# Patient Record
Sex: Male | Born: 1975 | Race: White | Hispanic: No | Marital: Married | Smoking: Former smoker
Health system: Southern US, Community
[De-identification: ages and names within clinical notes are randomized; demographics above are authoritative.]

## PROBLEM LIST (undated history)

## (undated) DIAGNOSIS — I4891 Unspecified atrial fibrillation: Secondary | ICD-10-CM

## (undated) DIAGNOSIS — I4892 Unspecified atrial flutter: Secondary | ICD-10-CM

## (undated) DIAGNOSIS — I10 Essential (primary) hypertension: Secondary | ICD-10-CM

## (undated) DIAGNOSIS — R55 Syncope and collapse: Secondary | ICD-10-CM

## (undated) HISTORY — DX: Unspecified atrial fibrillation: I48.91

## (undated) HISTORY — DX: Essential (primary) hypertension: I10

## (undated) HISTORY — DX: Unspecified atrial flutter: I48.92

---

## 2008-10-05 ENCOUNTER — Encounter (INDEPENDENT_AMBULATORY_CARE_PROVIDER_SITE_OTHER): Payer: Self-pay

## 2008-10-05 ENCOUNTER — Emergency Department (HOSPITAL_COMMUNITY): Admission: EM | Admit: 2008-10-05 | Discharge: 2008-10-06 | Payer: Self-pay | Admitting: Emergency Medicine

## 2008-10-05 ENCOUNTER — Encounter: Payer: Self-pay | Admitting: Cardiology

## 2008-10-05 LAB — CONVERTED CEMR LAB
CK-MB: <1 ng/mL
Calcium: 9.3 mg/dL
Chloride: 101 meq/L
Glomerular Filtration Rate, Af Am: >60 mL/min/{1.73_m2}
HCT: 45.6 %
MCV: 91.6 fL
Platelets: 205 10*3/uL
Potassium: 4.1 meq/L
Sodium: 137 meq/L
Troponin I: <0.05 ng/mL
WBC number, urine, microscopy: 8.5 /hpf

## 2008-10-22 ENCOUNTER — Ambulatory Visit: Payer: Self-pay | Admitting: Cardiology

## 2008-10-22 ENCOUNTER — Encounter (INDEPENDENT_AMBULATORY_CARE_PROVIDER_SITE_OTHER): Payer: Self-pay

## 2008-10-22 DIAGNOSIS — I4892 Unspecified atrial flutter: Secondary | ICD-10-CM

## 2008-10-22 DIAGNOSIS — I4891 Unspecified atrial fibrillation: Secondary | ICD-10-CM

## 2008-10-24 ENCOUNTER — Encounter: Payer: Self-pay | Admitting: Cardiology

## 2008-10-24 ENCOUNTER — Ambulatory Visit: Payer: Self-pay | Admitting: Cardiology

## 2008-10-24 ENCOUNTER — Ambulatory Visit (HOSPITAL_COMMUNITY): Admission: RE | Admit: 2008-10-24 | Discharge: 2008-10-24 | Payer: Self-pay | Admitting: Cardiology

## 2008-10-25 ENCOUNTER — Encounter (INDEPENDENT_AMBULATORY_CARE_PROVIDER_SITE_OTHER): Payer: Self-pay | Admitting: *Deleted

## 2008-10-25 ENCOUNTER — Encounter: Payer: Self-pay | Admitting: Cardiology

## 2008-11-06 ENCOUNTER — Telehealth (INDEPENDENT_AMBULATORY_CARE_PROVIDER_SITE_OTHER): Payer: Self-pay

## 2008-11-20 ENCOUNTER — Ambulatory Visit: Payer: Self-pay | Admitting: Internal Medicine

## 2008-11-25 ENCOUNTER — Ambulatory Visit: Payer: Self-pay | Admitting: Cardiology

## 2008-11-25 ENCOUNTER — Inpatient Hospital Stay (HOSPITAL_COMMUNITY): Admission: EM | Admit: 2008-11-25 | Discharge: 2008-11-25 | Payer: Self-pay | Admitting: Emergency Medicine

## 2008-12-20 ENCOUNTER — Telehealth (INDEPENDENT_AMBULATORY_CARE_PROVIDER_SITE_OTHER): Payer: Self-pay | Admitting: *Deleted

## 2010-03-20 ENCOUNTER — Emergency Department (HOSPITAL_COMMUNITY)
Admission: EM | Admit: 2010-03-20 | Discharge: 2010-03-20 | Disposition: A | Discharge status: Home / Self Care | Payer: Self-pay | Attending: Emergency Medicine | Admitting: Emergency Medicine

## 2010-03-20 ENCOUNTER — Emergency Department (HOSPITAL_COMMUNITY): Payer: Self-pay

## 2010-03-20 DIAGNOSIS — W010XXA Fall on same level from slipping, tripping and stumbling without subsequent striking against object, initial encounter: Secondary | ICD-10-CM | POA: Insufficient documentation

## 2010-03-20 DIAGNOSIS — R232 Flushing: Secondary | ICD-10-CM | POA: Insufficient documentation

## 2010-03-20 DIAGNOSIS — R51 Headache: Secondary | ICD-10-CM | POA: Insufficient documentation

## 2010-03-20 DIAGNOSIS — S0990XA Unspecified injury of head, initial encounter: Secondary | ICD-10-CM | POA: Insufficient documentation

## 2010-03-20 DIAGNOSIS — R259 Unspecified abnormal involuntary movements: Secondary | ICD-10-CM | POA: Insufficient documentation

## 2010-03-20 DIAGNOSIS — R5381 Other malaise: Secondary | ICD-10-CM | POA: Insufficient documentation

## 2010-03-20 DIAGNOSIS — IMO0002 Reserved for concepts with insufficient information to code with codable children: Secondary | ICD-10-CM | POA: Insufficient documentation

## 2010-03-20 DIAGNOSIS — I4891 Unspecified atrial fibrillation: Secondary | ICD-10-CM | POA: Insufficient documentation

## 2010-03-20 DIAGNOSIS — Y929 Unspecified place or not applicable: Secondary | ICD-10-CM | POA: Insufficient documentation

## 2010-03-20 DIAGNOSIS — R55 Syncope and collapse: Secondary | ICD-10-CM | POA: Insufficient documentation

## 2010-03-20 DIAGNOSIS — R5383 Other fatigue: Secondary | ICD-10-CM | POA: Insufficient documentation

## 2010-03-20 DIAGNOSIS — R42 Dizziness and giddiness: Secondary | ICD-10-CM | POA: Insufficient documentation

## 2010-03-20 LAB — URINALYSIS, ROUTINE W REFLEX MICROSCOPIC
Bilirubin Urine: NEGATIVE
Nitrite: NEGATIVE
Specific Gravity, Urine: 1.014 (ref 1.005–1.030)
Urobilinogen, UA: 0.2 mg/dL (ref 0.0–1.0)

## 2010-03-20 LAB — CBC
HCT: 42.1 % (ref 39.0–52.0)
MCHC: 35.2 g/dL (ref 30.0–36.0)
Platelets: 222 10*3/uL (ref 150–400)
RDW: 13 % (ref 11.5–15.5)
WBC: 8.3 10*3/uL (ref 4.0–10.5)

## 2010-03-20 LAB — BASIC METABOLIC PANEL
Calcium: 9.3 mg/dL (ref 8.4–10.5)
GFR calc Af Amer: >60 mL/min (ref >60)
GFR calc non Af Amer: >60 mL/min (ref >60)
Glucose, Bld: 98 mg/dL (ref 70–99)
Sodium: 138 mEq/L (ref 135–145)

## 2010-03-20 LAB — TSH: TSH: 2.204 u[IU]/mL (ref 0.350–4.500)

## 2010-04-08 LAB — URINALYSIS, ROUTINE W REFLEX MICROSCOPIC
Bilirubin Urine: NEGATIVE
Glucose, UA: NEGATIVE mg/dL
Hgb urine dipstick: NEGATIVE
Ketones, ur: NEGATIVE mg/dL
Protein, ur: NEGATIVE mg/dL

## 2010-04-08 LAB — BASIC METABOLIC PANEL
BUN: 13 mg/dL (ref 6–23)
BUN: 16 mg/dL (ref 6–23)
CO2: 24 mEq/L (ref 19–32)
CO2: 28 mEq/L (ref 19–32)
Chloride: 102 mEq/L (ref 96–112)
Chloride: 106 mEq/L (ref 96–112)
Creatinine, Ser: 1 mg/dL (ref 0.4–1.5)
Creatinine, Ser: 1.01 mg/dL (ref 0.4–1.5)
Glucose, Bld: 112 mg/dL — ABNORMAL HIGH (ref 70–99)

## 2010-04-08 LAB — CBC
HCT: 42.9 % (ref 39.0–52.0)
Hemoglobin: 15 g/dL (ref 13.0–17.0)
MCHC: 34.5 g/dL (ref 30.0–36.0)
MCHC: 35 g/dL (ref 30.0–36.0)
MCV: 92.7 fL (ref 78.0–100.0)
Platelets: 271 10*3/uL (ref 150–400)
RDW: 13.3 % (ref 11.5–15.5)

## 2010-04-08 LAB — DIFFERENTIAL
Basophils Absolute: 0 10*3/uL (ref 0.0–0.1)
Basophils Relative: 0 % (ref 0–1)
Basophils Relative: 1 % (ref 0–1)
Eosinophils Absolute: 0.2 10*3/uL (ref 0.0–0.7)
Monocytes Relative: 4 % (ref 3–12)
Neutro Abs: 12.1 10*3/uL — ABNORMAL HIGH (ref 1.7–7.7)
Neutrophils Relative %: 42 % — ABNORMAL LOW (ref 43–77)
Neutrophils Relative %: 85 % — ABNORMAL HIGH (ref 43–77)

## 2010-04-08 LAB — NA AND K (SODIUM & POTASSIUM), RAND UR: Potassium Urine: 30 mEq/L

## 2010-04-08 LAB — COMPREHENSIVE METABOLIC PANEL
Alkaline Phosphatase: 55 U/L (ref 39–117)
BUN: 16 mg/dL (ref 6–23)
Calcium: 8.9 mg/dL (ref 8.4–10.5)
Glucose, Bld: 134 mg/dL — ABNORMAL HIGH (ref 70–99)
Potassium: 4 mEq/L (ref 3.5–5.1)
Total Protein: 6.4 g/dL (ref 6.0–8.3)

## 2010-04-08 LAB — HEMOGLOBIN A1C: Hgb A1c MFr Bld: 5.5 % (ref 4.6–6.1)

## 2010-04-08 LAB — D-DIMER, QUANTITATIVE: D-Dimer, Quant: 0.33 ug/mL-FEU (ref 0.00–0.48)

## 2010-04-08 LAB — TSH: TSH: 3.96 u[IU]/mL (ref 0.350–4.500)

## 2010-04-09 LAB — ACETAMINOPHEN LEVEL: Acetaminophen (Tylenol), Serum: <10 ug/mL — ABNORMAL LOW (ref 10–30)

## 2010-04-09 LAB — POCT CARDIAC MARKERS
CKMB, poc: <1 ng/mL — ABNORMAL LOW (ref 1.0–8.0)
Troponin i, poc: <0.05 ng/mL (ref 0.00–0.09)

## 2010-04-09 LAB — URINALYSIS, ROUTINE W REFLEX MICROSCOPIC
Glucose, UA: NEGATIVE mg/dL
Hgb urine dipstick: NEGATIVE
Ketones, ur: NEGATIVE mg/dL
Protein, ur: NEGATIVE mg/dL

## 2010-04-09 LAB — BASIC METABOLIC PANEL
BUN: 16 mg/dL (ref 6–23)
CO2: 29 mEq/L (ref 19–32)
Chloride: 101 mEq/L (ref 96–112)
Glucose, Bld: 143 mg/dL — ABNORMAL HIGH (ref 70–99)
Potassium: 4.1 mEq/L (ref 3.5–5.1)
Sodium: 137 mEq/L (ref 135–145)

## 2010-04-09 LAB — DIFFERENTIAL
Basophils Absolute: 0 10*3/uL (ref 0.0–0.1)
Eosinophils Absolute: 0.2 10*3/uL (ref 0.0–0.7)
Eosinophils Relative: 3 % (ref 0–5)
Lymphocytes Relative: 28 % (ref 12–46)
Monocytes Absolute: 0.4 10*3/uL (ref 0.1–1.0)

## 2010-04-09 LAB — CBC
HCT: 45.6 % (ref 39.0–52.0)
Hemoglobin: 15.8 g/dL (ref 13.0–17.0)
MCHC: 34.6 g/dL (ref 30.0–36.0)
MCV: 91.6 fL (ref 78.0–100.0)
Platelets: 205 10*3/uL (ref 150–400)
RDW: 12.6 % (ref 11.5–15.5)

## 2010-04-09 LAB — RAPID URINE DRUG SCREEN, HOSP PERFORMED
Amphetamines: NOT DETECTED
Benzodiazepines: NOT DETECTED
Cocaine: NOT DETECTED

## 2010-04-09 LAB — APTT: aPTT: 24 seconds (ref 24–37)

## 2010-04-09 LAB — SALICYLATE LEVEL: Salicylate Lvl: <4 mg/dL (ref 2.8–20.0)

## 2010-12-30 ENCOUNTER — Encounter: Payer: Self-pay | Admitting: Cardiology

## 2013-01-17 ENCOUNTER — Emergency Department (HOSPITAL_COMMUNITY)
Admission: EM | Admit: 2013-01-17 | Discharge: 2013-01-17 | Disposition: A | Discharge status: Home / Self Care | Payer: BC Managed Care – PPO | Attending: Emergency Medicine | Admitting: Emergency Medicine

## 2013-01-17 ENCOUNTER — Encounter (HOSPITAL_COMMUNITY): Payer: Self-pay | Admitting: Emergency Medicine

## 2013-01-17 DIAGNOSIS — M549 Dorsalgia, unspecified: Secondary | ICD-10-CM

## 2013-01-17 DIAGNOSIS — Z87891 Personal history of nicotine dependence: Secondary | ICD-10-CM | POA: Insufficient documentation

## 2013-01-17 DIAGNOSIS — M545 Low back pain, unspecified: Secondary | ICD-10-CM | POA: Insufficient documentation

## 2013-01-17 DIAGNOSIS — Z8679 Personal history of other diseases of the circulatory system: Secondary | ICD-10-CM | POA: Insufficient documentation

## 2013-01-17 LAB — URINALYSIS, ROUTINE W REFLEX MICROSCOPIC
Bilirubin Urine: NEGATIVE
GLUCOSE, UA: NEGATIVE mg/dL
HGB URINE DIPSTICK: NEGATIVE
Ketones, ur: NEGATIVE mg/dL
Leukocytes, UA: NEGATIVE
Nitrite: NEGATIVE
PH: 6.5 (ref 5.0–8.0)
PROTEIN: NEGATIVE mg/dL
Specific Gravity, Urine: 1.025 (ref 1.005–1.030)
Urobilinogen, UA: 1 mg/dL (ref 0.0–1.0)

## 2013-01-17 LAB — COMPREHENSIVE METABOLIC PANEL
ALT: 30 U/L (ref 0–53)
AST: 24 U/L (ref 0–37)
Albumin: 4.1 g/dL (ref 3.5–5.2)
Alkaline Phosphatase: 53 U/L (ref 39–117)
BILIRUBIN TOTAL: 0.4 mg/dL (ref 0.3–1.2)
BUN: 23 mg/dL (ref 6–23)
CALCIUM: 9.7 mg/dL (ref 8.4–10.5)
CHLORIDE: 104 meq/L (ref 96–112)
CO2: 26 meq/L (ref 19–32)
CREATININE: 1.03 mg/dL (ref 0.50–1.35)
GFR, EST NON AFRICAN AMERICAN: 90 mL/min — AB (ref >90)
GLUCOSE: 92 mg/dL (ref 70–99)
Potassium: 4.5 mEq/L (ref 3.7–5.3)
Sodium: 143 mEq/L (ref 137–147)
Total Protein: 7.5 g/dL (ref 6.0–8.3)

## 2013-01-17 LAB — CBC WITH DIFFERENTIAL/PLATELET
BASOS ABS: 0 10*3/uL (ref 0.0–0.1)
Basophils Relative: 0 % (ref 0–1)
EOS PCT: 1 % (ref 0–5)
Eosinophils Absolute: 0.1 10*3/uL (ref 0.0–0.7)
HEMATOCRIT: 41.3 % (ref 39.0–52.0)
HEMOGLOBIN: 14.7 g/dL (ref 13.0–17.0)
LYMPHS ABS: 2.7 10*3/uL (ref 0.7–4.0)
LYMPHS PCT: 33 % (ref 12–46)
MCH: 32.4 pg (ref 26.0–34.0)
MCHC: 35.6 g/dL (ref 30.0–36.0)
MCV: 91 fL (ref 78.0–100.0)
MONO ABS: 0.8 10*3/uL (ref 0.1–1.0)
MONOS PCT: 9 % (ref 3–12)
Neutro Abs: 4.6 10*3/uL (ref 1.7–7.7)
Neutrophils Relative %: 56 % (ref 43–77)
Platelets: 222 10*3/uL (ref 150–400)
RBC: 4.54 MIL/uL (ref 4.22–5.81)
RDW: 12.9 % (ref 11.5–15.5)
WBC: 8.3 10*3/uL (ref 4.0–10.5)

## 2013-01-17 LAB — TROPONIN I

## 2013-01-17 MED ORDER — OXYCODONE-ACETAMINOPHEN 5-325 MG PO TABS
ORAL_TABLET | ORAL | Status: AC
  Filled 2013-01-17: qty 1

## 2013-01-17 MED ORDER — OXYCODONE-ACETAMINOPHEN 5-325 MG PO TABS
2.0000 | ORAL_TABLET | Freq: Once | ORAL | Status: AC
  Administered 2013-01-17: 2 via ORAL
  Filled 2013-01-17: qty 2

## 2013-01-17 MED ORDER — IBUPROFEN 800 MG PO TABS
800.0000 mg | ORAL_TABLET | Freq: Once | ORAL | Status: AC
  Administered 2013-01-17: 800 mg via ORAL
  Filled 2013-01-17: qty 1

## 2013-01-17 MED ORDER — HYDROCODONE-ACETAMINOPHEN 5-325 MG PO TABS: 2.0000 | ORAL_TABLET | ORAL | Status: DC | PRN

## 2013-01-17 MED ORDER — IBUPROFEN 800 MG PO TABS: 800.0000 mg | ORAL_TABLET | Freq: Three times a day (TID) | ORAL | Status: DC

## 2013-01-17 NOTE — ED Notes (Addendum)
Ambulated pt. Pt ambulated with steady gait. Pt denies any dizziness,weakness at time of ambulation.EDP aware, no new orders given at this time.

## 2013-01-17 NOTE — ED Notes (Signed)
Pt states was bending a lot Monday. Now c/o pain to lwoer back radiating down both hips and legs. Nad. Had not taken anything for pain.

## 2013-01-17 NOTE — ED Notes (Signed)
Pt significant other driving pt home.

## 2013-01-17 NOTE — Discharge Instructions (Signed)
Back Pain, Adult Low back pain is very common. About 1 in 5 people have back pain.The cause of low back pain is rarely dangerous. The pain often gets better over time.About half of people with a sudden onset of back pain feel better in just 2 weeks. About 8 in 10 people feel better by 6 weeks.  CAUSES Some common causes of back pain include:  Strain of the muscles or ligaments supporting the spine.  Wear and tear (degeneration) of the spinal discs.  Arthritis.  Direct injury to the back. DIAGNOSIS Most of the time, the direct cause of low back pain is not known.However, back pain can be treated effectively even when the exact cause of the pain is unknown.Answering your caregiver's questions about your overall health and symptoms is one of the most accurate ways to make sure the cause of your pain is not dangerous. If your caregiver needs more information, he or she may order lab work or imaging tests (X-rays or MRIs).However, even if imaging tests show changes in your back, this usually does not require surgery. HOME CARE INSTRUCTIONS For many people, back pain returns.Since low back pain is rarely dangerous, it is often a condition that people can learn to manageon their own.   Remain active. It is stressful on the back to sit or stand in one place. Do not sit, drive, or stand in one place for more than 30 minutes at a time. Take short walks on level surfaces as soon as pain allows.Try to increase the length of time you walk each day.  Do not stay in bed.Resting more than 1 or 2 days can delay your recovery.  Do not avoid exercise or work.Your body is made to move.It is not dangerous to be active, even though your back may hurt.Your back will likely heal faster if you return to being active before your pain is gone.  Pay attention to your body when you bend and lift. Many people have less discomfortwhen lifting if they bend their knees, keep the load close to their bodies,and  avoid twisting. Often, the most comfortable positions are those that put less stress on your recovering back.  Find a comfortable position to sleep. Use a firm mattress and lie on your side with your knees slightly bent. If you lie on your back, put a pillow under your knees.  Only take over-the-counter or prescription medicines as directed by your caregiver. Over-the-counter medicines to reduce pain and inflammation are often the most helpful.Your caregiver may prescribe muscle relaxant drugs.These medicines help dull your pain so you can more quickly return to your normal activities and healthy exercise.  Put ice on the injured area.  Put ice in a plastic bag.  Place a towel between your skin and the bag.  Leave the ice on for 15-20 minutes, 03-04 times a day for the first 2 to 3 days. After that, ice and heat may be alternated to reduce pain and spasms.  Ask your caregiver about trying back exercises and gentle massage. This may be of some benefit.  Avoid feeling anxious or stressed.Stress increases muscle tension and can worsen back pain.It is important to recognize when you are anxious or stressed and learn ways to manage it.Exercise is a great option. SEEK MEDICAL CARE IF:  You have pain that is not relieved with rest or medicine.  You have pain that does not improve in 1 week.  You have new symptoms.  You are generally not feeling well. SEEK   IMMEDIATE MEDICAL CARE IF:   You have pain that radiates from your back into your legs.  You develop new bowel or bladder control problems.  You have unusual weakness or numbness in your arms or legs.  You develop nausea or vomiting.  You develop abdominal pain.  You feel faint. Document Released: 12/21/2004 Document Revised: 06/22/2011 Document Reviewed: 05/11/2010 Exit[employer] Patient Information 2014 [employer].  

## 2013-01-17 NOTE — ED Notes (Signed)
Pt stated as going to room that he had near syncopal episode with being real sweaty yesterday.

## 2013-01-17 NOTE — ED Provider Notes (Signed)
CSN: [id number]     Arrival date & time 01/17/13  1338 History   First MD Initiated Contact with Patient 01/17/13 1353     Chief Complaint  Patient presents with  . Back Pain   (Consider location/radiation/quality/duration/timing/severity/associated sxs/prior Treatment) HPI Comments: Patient complains of a three-day history of low back pain that has been constant and radiates down his left leg. Denies any injuries. He states he was doing some bent over work 2 days ago. He denies any falls or injuries. No focal weakness, numbness or tingling. No bowel or bladder incontinence. No fevers or vomiting. Yesterday he had a near syncopal episode because the pain was so bad. No chest pain or shortness of breath.  The history is provided by the patient.    Past Medical History  Diagnosis Date  . Atrial fibrillation   . Atrial flutter    History reviewed. No pertinent past surgical history. History reviewed. No pertinent family history. History  Substance Use Topics  . Smoking status: Former Games developer  . Smokeless tobacco: Not on file  . Alcohol Use: Yes     Comment: Social    Review of Systems  Constitutional: Negative for fever, activity change and appetite change.  Respiratory: Negative for choking.   Cardiovascular: Negative for chest pain.  Gastrointestinal: Negative for nausea, vomiting and abdominal pain.  Genitourinary: Negative for dysuria and hematuria.  Musculoskeletal: Positive for back pain.  Skin: Negative for rash.  Neurological: Negative for dizziness, weakness and headaches.  A complete 10 system review of systems was obtained and all systems are negative except as noted in the HPI and PMH.    Allergies  Review of patient's allergies indicates no known allergies.  Home Medications   Current Outpatient Rx  Name  Route  Sig  Dispense  Refill  . ibuprofen (ADVIL,MOTRIN) 200 MG tablet   Oral   Take 400 mg by mouth every 6 (six) hours as needed for moderate pain.          Marland Kitchen HYDROcodone-acetaminophen (NORCO/VICODIN) 5-325 MG per tablet   Oral   Take 2 tablets by mouth every 4 (four) hours as needed.   10 tablet   0   . ibuprofen (ADVIL,MOTRIN) 800 MG tablet   Oral   Take 1 tablet (800 mg total) by mouth 3 (three) times daily.   21 tablet   0    BP 125/74  Pulse 73  Temp(Src) 97.3 F (36.3 C) (Oral)  Resp 21  SpO2 96% Physical Exam  Constitutional: He is oriented to person, place, and time. He appears well-developed and well-nourished. No distress.  HENT:  Head: Normocephalic and atraumatic.  Mouth/Throat: Oropharynx is clear and moist. No oropharyngeal exudate.  Eyes: Conjunctivae and EOM are normal. Pupils are equal, round, and reactive to light.  Neck: Normal range of motion. Neck supple.  Cardiovascular: Normal rate, regular rhythm and normal heart sounds.   No murmur heard. Pulmonary/Chest: Effort normal and breath sounds normal. No respiratory distress.  Abdominal: Soft. There is no tenderness. There is no rebound and no guarding.  Musculoskeletal: Normal range of motion. He exhibits no edema and no tenderness.  5/5 strength in bilateral lower extremities. Ankle plantar and dorsiflexion intact. Great toe extension intact bilaterally. +2 DP and PT pulses. +2 patellar reflexes bilaterally. Normal gait.  L paraspinal lumbar tenderness  Neurological: He is alert and oriented to person, place, and time. No cranial nerve deficit. He exhibits normal muscle tone. Coordination normal.  Skin: Skin is warm.  ED Course  Procedures (including critical care time) Labs Review Labs Reviewed  COMPREHENSIVE METABOLIC PANEL - Abnormal; Notable for the following:    GFR calc non Af Amer 90 (*)    All other components within normal limits  URINALYSIS, ROUTINE W REFLEX MICROSCOPIC  CBC WITH DIFFERENTIAL  TROPONIN I   Imaging Review No results found.  EKG Interpretation    Date/Time:  Wednesday January 17 2013 13:46:58 EST Ventricular Rate:   76 PR Interval:  202 QRS Duration: 110 QT Interval:  396 QTC Calculation: 445 R Axis:   -3 Text Interpretation:  Normal sinus rhythm Normal ECG When compared with ECG of 20-Mar-2010 13:37, No significant change was found No significant change was found Confirmed by Manus GunningANCOUR  MD, Alok Minshall ([id number]) on 01/17/2013 2:11:40 PM            MDM   1. Back pain    Low back pain after bending over 2 days ago. Radiates in the left leg. No focal weakness, numbness or tingling. No bowel bladder incontinence.  UA negative.  Pain improved after meds in ED.  Able to ambulate without difficulty.  Will treat as sciatica.  Antiinflammatories and analgesics.  PCP referral.  No evidence of cord compression or cauda equina.   Glynn OctaveStephen Aniruddh Ciavarella, MD 01/17/13 [phone]

## 2013-02-06 ENCOUNTER — Ambulatory Visit (INDEPENDENT_AMBULATORY_CARE_PROVIDER_SITE_OTHER): Payer: BC Managed Care – PPO | Admitting: Family Medicine

## 2013-02-06 ENCOUNTER — Encounter: Payer: Self-pay | Admitting: Family Medicine

## 2013-02-06 VITALS — BP 102/70 | HR 73 | Temp 98.2°F | Resp 18 | Ht 72.0 in | Wt 238.0 lb

## 2013-02-06 DIAGNOSIS — R6889 Other general symptoms and signs: Secondary | ICD-10-CM

## 2013-02-06 DIAGNOSIS — I4891 Unspecified atrial fibrillation: Secondary | ICD-10-CM

## 2013-02-06 DIAGNOSIS — IMO0001 Reserved for inherently not codable concepts without codable children: Secondary | ICD-10-CM

## 2013-02-06 DIAGNOSIS — R109 Unspecified abdominal pain: Secondary | ICD-10-CM | POA: Insufficient documentation

## 2013-02-06 DIAGNOSIS — R61 Generalized hyperhidrosis: Secondary | ICD-10-CM

## 2013-02-06 DIAGNOSIS — Z87898 Personal history of other specified conditions: Secondary | ICD-10-CM

## 2013-02-06 DIAGNOSIS — R197 Diarrhea, unspecified: Secondary | ICD-10-CM | POA: Insufficient documentation

## 2013-02-06 DIAGNOSIS — R42 Dizziness and giddiness: Secondary | ICD-10-CM

## 2013-02-06 DIAGNOSIS — Z9189 Other specified personal risk factors, not elsewhere classified: Secondary | ICD-10-CM

## 2013-02-06 DIAGNOSIS — Z8709 Personal history of other diseases of the respiratory system: Secondary | ICD-10-CM
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
## 2013-02-06 NOTE — Patient Instructions (Signed)
Atrial Fibrillation Atrial fibrillation is a type of irregular heart rhythm (arrhythmia). During atrial fibrillation, the upper chambers of the heart (atria) quiver continuously in a chaotic pattern. This causes an irregular and often rapid heart rate.  Atrial fibrillation is the result of the heart becoming overloaded with disorganized signals that tell it to beat. These signals are normally released one at a time by a part of the right atrium called the sinoatrial node. They then travel from the atria to the lower chambers of the heart (ventricles), causing the atria and ventricles to contract and pump blood as they pass. In atrial fibrillation, parts of the atria outside of the sinoatrial node also release these signals. This results in two problems. First, the atria receive so many signals that they do not have time to fully contract. Second, the ventricles, which can only receive one signal at a time, beat irregularly and out of rhythm with the atria.  There are three types of atrial fibrillation:   Paroxysmal Paroxysmal atrial fibrillation starts suddenly and stops on its own within a week.   Persistent Persistent atrial fibrillation lasts for more than a week. It may stop on its own or with treatment.   Permanent Permanent atrial fibrillation does not go away. Episodes of atrial fibrillation may lead to permanent atrial fibrillation.  Atrial fibrillation can prevent your heart from pumping blood normally. It increases your risk of stroke and can lead to heart failure.  CAUSES   Heart conditions, including a heart attack, heart failure, coronary artery disease, and heart valve conditions.   Inflammation of the sac that surrounds the heart (pericarditis).   Blockage of an artery in the lungs (pulmonary embolism).   Pneumonia or other infections.   Chronic lung disease.   Thyroid problems, especially if the thyroid is overactive (hyperthyroidism).   Caffeine, excessive alcohol  use, and use of some illegal drugs.   Use of some medications, including certain decongestants and diet pills.   Heart surgery.   Birth defects.  Sometimes, no cause can be found. When this happens, the atrial fibrillation is called lone atrial fibrillation. The risk of complications from atrial fibrillation increases if you have lone atrial fibrillation and you are age 40 years or older. RISK FACTORS  Heart failure.  Coronary artery disease  Diabetes mellitus.   High blood pressure (hypertension).   Obesity.   Other arrhythmias.   Increased age. SYMPTOMS   A feeling that your heart is beating rapidly or irregularly.   A feeling of discomfort or pain in your chest.   Shortness of breath.   Sudden lightheadedness or weakness.   Getting tired easily when exercising.   Urinating more often than normal (mainly when atrial fibrillation first begins).  In paroxysmal atrial fibrillation, symptoms may start and suddenly stop. DIAGNOSIS  Your caregiver may be able to detect atrial fibrillation when taking your pulse. Usually, testing is needed to diagnosis atrial fibrillation. Tests may include:   Electrocardiography. During this test, the electrical impulses of your heart are recorded while you are lying down.   Echocardiography. During echocardiography, sound waves are used to evaluate how blood flows through your heart.   Stress test. There is more than one type of stress test. If a stress test is needed, ask your caregiver about which type is best for you.   Chest X-ray exam.   Blood tests.   Computed tomography (CT).  TREATMENT   Treating any underlying conditions. For example, if you have an overactive  thyroid, treating the condition may correct atrial fibrillation.   Medication. Medications may be given to control a rapid heart rate or to prevent blood clots, heart failure, or a stroke.   Procedure to correct the rhythm of the  heart:  Electrical cardioversion. During electrical cardioversion, a controlled, low-energy shock is delivered to the heart through your skin. If you have chest pain, very low pressure blood pressure, or sudden heart failure, this procedure may need to be done as an emergency.  Catheter ablation. During this procedure, heart tissues that send the signals that cause atrial fibrillation are destroyed.  Maze or minimaze procedure. During this surgery, thin lines of heart tissue that carry the abnormal signals are destroyed. The maze procedure is an open-heart surgery. The minimaze procedure is a minimally invasive surgery. This means that small cuts are made to access the heart instead of a large opening.  Pulmonary venous isolation. During this surgery, tissue around the veins that carry blood from the lungs (pulmonary veins) is destroyed. This tissue is thought to carry the abnormal signals. HOME CARE INSTRUCTIONS   Take medications as directed by your caregiver.  Only take medications that your caregiver approves. Some medications can make atrial fibrillation worse or recur.  If blood thinners were prescribed by your caregiver, take them exactly as directed. Too much can cause bleeding. Too little and you will not have the needed protection against stroke and other problems.  Perform blood tests at home if directed by your caregiver.  Perform blood tests exactly as directed.   Quit smoking if you smoke.   Do not drink alcohol.   Do not drink caffeinated beverages such as coffee, soda, and some teas. You may drink decaffeinated coffee, soda, or tea.   Maintain a healthy weight. Do not use diet pills unless your caregiver approves. They may make heart problems worse.   Follow diet instructions as directed by your caregiver.   Exercise regularly as directed by your caregiver.   Keep all follow-up appointments. PREVENTION  The following substances can cause atrial fibrillation  to recur:   Caffeinated beverages.   Alcohol.   Certain medications, especially those used for breathing problems.   Certain herbs and herbal medications, such as those containing ephedra or ginseng.  Illegal drugs such as cocaine and amphetamines. Sometimes medications are given to prevent atrial fibrillation from recurring. Proper treatment of any underlying condition is also important in helping prevent recurrence.  SEEK MEDICAL CARE IF:  You notice a change in the rate, rhythm, or strength of your heartbeat.   You suddenly begin urinating more frequently.   You tire more easily when exerting yourself or exercising.  SEEK IMMEDIATE MEDICAL CARE IF:   You develop chest pain, abdominal pain, sweating, or weakness.  You feel sick to your stomach (nauseous).  You develop shortness of breath.  You suddenly develop swollen feet and ankles.  You feel dizzy.  You face or limbs feel numb or weak.  There is a change in your vision or speech. MAKE SURE YOU:   Understand these instructions.  Will watch your condition.  Will get help right away if you are not doing well or get worse. Document Released: 12/21/2004 Document Revised: 04/17/2012 Document Reviewed: 02/01/2012 [facility] Patient Information 2014 [employer], [place].  Catecholamines, Plasma and Urine This is a test used to evaluate persistent or episodic high blood pressure, severe headaches, rapid heart rate, and sweating. Catecholamines are a group of similar hormones produced in the medulla (central portion)  of the adrenal glands. The primary catecholamines are dopamine, epinephrine (adrenaline), and norepinephrine. These hormones are released into the bloodstream in response to physical or emotional stress. They help transmit nerve impulses in the brain, increase glucose and fatty acid release (for energy), dilate bronchioles (small air passages in the lungs), and dilate the pupils. Norepinephrine also constricts  blood vessels (increasing blood pressure) and epinephrine increases heart rate and metabolism.  Urine and plasma catecholamine testing can be used to help detect the presence of pheochromocytomas and paragangliomas. It is important to diagnose and treat these rare tumors because they cause a potentially curable form of hypertension. In most cases, the tumors can be surgically removed and/or treated to significantly reduce the amount of catecholamines being produced and to reduce or eliminate their associated symptoms and complications.  Catecholamine testing measures the amount of epinephrine, norepinephrine, and dopamine in the plasma or urine. (The metabolites of these hormones may be measured separately with a urine metanephrine and/or VMA test). The plasma catecholamine test measures the amount of hormones present at the moment of collection, while the urine test measures the amount excreted over a 24-hour period. PREPARATION FOR TEST A blood sample drawn from a vein in the arm or a 24-hour urine sample is collected. For the 24-hour urine collection, all of your urine should be saved for a 24-hour period. It is important that the sample be refrigerated during this time period. Since diet, exercise, and drugs may affect catecholamine levels, precautions need to be taken to assure that the sample reflects a true metabolic condition and not an interference or aberration. For this reason you should talk to your caregiver about your diet and any medications you are taking. Foods such as coffee (including decaf), tea, chocolate, vanilla, bananas, oranges and other citrus fruits should be avoided for several days prior to the test and during collection. There are also many medications that can potentially affect test results. Talk to your caregiver about the prescriptions and over the counter drugs and supplements that you are taking. Wherever possible, those that are known to interfere should be discontinued  prior to and during sample collection. Emotional and physical stresses and vigorous exercise should be minimized prior to and during test collection as they can increase catecholamine secretion. For urine collection tests in females, tell the person doing the test if you are menstruating on the day the test is done. NORMAL FINDINGS VMA  Adult/elderly: Less than 6.8 mg/24 hr or <35 micromole/24 hr (SI units)  Adolescent: 1-5 mg/24 hr  Child: 1-3 mg/24 hr  Infant: Less than 2 mg/24 hr  Newborn: Less than 1 mg/24 hr Catecholamines  Free Catecholamines  Less than 100 mcg/24 hr or <590 nmol/day (SI units) Epinephrine  Adult/elderly: Less than 20 mcg/24 hr or <109 nmol/day (SI units)  Child:  0-1 years: 0-2.5 mcg/24 hr  1-2 years: 0-3.5 mcg/24 hr  2-4 years: 0-6 mcg/24 hr  4-7 years: 0.2-10 mcg/24 hr  7-10 years: 0.5-14 mcg/24 hr Norepinephrine  Adult/elderly: Less than 100 mcg/24 hr or Less than 590 nmol/day (SI units)  Child:  0-1 years: 0-10 mcg/24 hr  1-2 years: 0-17 mcg/24 hr  2-4 years: 4-29 mcg/24 hr  4-7 years: 8-45 mcg/24 hr  7-10 years: 13-65 mcg/24 hr Dopamine  Adult/elderly: 65-400 mcg/24 hr  Child:  0-1 year: 0-85 mcg/24 hr  1-2 years: 10-140 mcg/24 hr  2-4 years: 40-260 mcg/24 hr  Greater than 4 years: 65-400 mcg/24 hr Metanephrine  Less than 1.3 mg/24  hr or Less than 7 micromole/day (SI units) Normetanephrine  15-80 mcg/24 hr or 89-473 nmol/day (SI units) Ranges for normal findings may vary among different laboratories and hospitals. You should always check with your doctor after having lab work or other tests done to discuss the meaning of your test results and whether your values are considered within normal limits. MEANING OF TEST  Your caregiver will go over the test results with you and discuss the importance and meaning of your results, as well as treatment options and the need for additional tests if necessary. OBTAINING THE TEST  RESULTS It is your responsibility to obtain your test results. Ask the lab or department performing the test when and how you will get your results. Document Released: 01/13/2004 Document Revised: 03/15/2011 Document Reviewed: 11/30/2007 [facility] Patient Information 2014 [place], [place].  Syncope Syncope is a fainting spell. This means the person loses consciousness and drops to the ground. The person is generally unconscious for less than 5 minutes. The person may have some muscle twitches for up to 15 seconds before waking up and returning to normal. Syncope occurs more often in elderly people, but it can happen to anyone. While most causes of syncope are not dangerous, syncope can be a sign of a serious medical problem. It is important to seek medical care.  CAUSES  Syncope is caused by a sudden decrease in blood flow to the brain. The specific cause is often not determined. Factors that can trigger syncope include:  Taking medicines that lower blood pressure.  Sudden changes in posture, such as standing up suddenly.  Taking more medicine than prescribed.  Standing in one place for too long.  Seizure disorders.  Dehydration and excessive exposure to heat.  Low blood sugar (hypoglycemia).  Straining to have a bowel movement.  Heart disease, irregular heartbeat, or other circulatory problems.  Fear, emotional distress, seeing blood, or severe pain. SYMPTOMS  Right before fainting, you may:  Feel dizzy or lightheaded.  Feel nauseous.  See all white or all black in your field of vision.  Have cold, clammy skin. DIAGNOSIS  Your caregiver will ask about your symptoms, perform a physical exam, and perform electrocardiography (ECG) to record the electrical activity of your heart. Your caregiver may also perform other heart or blood tests to determine the cause of your syncope. TREATMENT  In most cases, no treatment is needed. Depending on the cause of your syncope, your caregiver  may recommend changing or stopping some of your medicines. HOME CARE INSTRUCTIONS  Have someone stay with you until you feel stable.  Do not drive, operate machinery, or play sports until your caregiver says it is okay.  Keep all follow-up appointments as directed by your caregiver.  Lie down right away if you start feeling like you might faint. Breathe deeply and steadily. Wait until all the symptoms have passed.  Drink enough fluids to keep your urine clear or pale yellow.  If you are taking blood pressure or heart medicine, get up slowly, taking several minutes to sit and then stand. This can reduce dizziness. SEEK IMMEDIATE MEDICAL CARE IF:   You have a severe headache.  You have unusual pain in the chest, abdomen, or back.  You are bleeding from the mouth or rectum, or you have black or tarry stool.  You have an irregular or very fast heartbeat.  You have pain with breathing.  You have repeated fainting or seizure-like jerking during an episode.  You faint when sitting or  lying down.  You have confusion.  You have difficulty walking.  You have severe weakness.  You have vision problems. If you fainted, call your local emergency services (911 in [place] Do not drive yourself to the hospital.  MAKE SURE YOU:  Understand these instructions.  Will watch your condition.  Will get help right away if you are not doing well or get worse. Document Released: 12/21/2004 Document Revised: 06/22/2011 Document Reviewed: 02/19/2011 [facility] Patient Information 2014 [place], [place].

## 2013-02-08 ENCOUNTER — Other Ambulatory Visit: Payer: Self-pay | Admitting: Family Medicine

## 2013-02-08 ENCOUNTER — Ambulatory Visit (HOSPITAL_COMMUNITY)
Admission: RE | Admit: 2013-02-08 | Discharge: 2013-02-08 | Disposition: A | Discharge status: Home / Self Care | Payer: BC Managed Care – PPO | Source: Ambulatory Visit | Attending: Family Medicine | Admitting: Family Medicine

## 2013-02-08 ENCOUNTER — Encounter: Payer: Self-pay | Admitting: Family Medicine

## 2013-02-08 DIAGNOSIS — R42 Dizziness and giddiness: Secondary | ICD-10-CM

## 2013-02-08 DIAGNOSIS — Z6832 Body mass index (BMI) 32.0-32.9, adult: Secondary | ICD-10-CM | POA: Insufficient documentation

## 2013-02-08 DIAGNOSIS — I4891 Unspecified atrial fibrillation: Secondary | ICD-10-CM | POA: Insufficient documentation

## 2013-02-08 DIAGNOSIS — IMO0001 Reserved for inherently not codable concepts without codable children: Secondary | ICD-10-CM

## 2013-02-08 DIAGNOSIS — I454 Nonspecific intraventricular block: Secondary | ICD-10-CM

## 2013-02-08 DIAGNOSIS — R55 Syncope and collapse: Secondary | ICD-10-CM | POA: Insufficient documentation

## 2013-02-08 DIAGNOSIS — I4892 Unspecified atrial flutter: Secondary | ICD-10-CM

## 2013-02-08 DIAGNOSIS — Z87898 Personal history of other specified conditions: Secondary | ICD-10-CM

## 2013-02-08 DIAGNOSIS — I519 Heart disease, unspecified: Secondary | ICD-10-CM

## 2013-02-08 LAB — CBC WITH DIFFERENTIAL/PLATELET
BASOS ABS: 0 10*3/uL (ref 0.0–0.1)
Basophils Relative: 0 % (ref 0–1)
Eosinophils Absolute: 0.1 10*3/uL (ref 0.0–0.7)
Eosinophils Relative: 2 % (ref 0–5)
HEMATOCRIT: 41.8 % (ref 39.0–52.0)
Hemoglobin: 14.8 g/dL (ref 13.0–17.0)
LYMPHS PCT: 35 % (ref 12–46)
Lymphs Abs: 2.4 10*3/uL (ref 0.7–4.0)
MCH: 31 pg (ref 26.0–34.0)
MCHC: 35.4 g/dL (ref 30.0–36.0)
MCV: 87.6 fL (ref 78.0–100.0)
Monocytes Absolute: 0.6 10*3/uL (ref 0.1–1.0)
Monocytes Relative: 9 % (ref 3–12)
Neutro Abs: 3.7 10*3/uL (ref 1.7–7.7)
Neutrophils Relative %: 54 % (ref 43–77)
Platelets: 280 10*3/uL (ref 150–400)
RBC: 4.77 MIL/uL (ref 4.22–5.81)
RDW: 13.2 % (ref 11.5–15.5)
WBC: 6.8 10*3/uL (ref 4.0–10.5)

## 2013-02-08 LAB — COMPREHENSIVE METABOLIC PANEL
ALT: 43 U/L (ref 0–53)
AST: 30 U/L (ref 0–37)
Albumin: 4.4 g/dL (ref 3.5–5.2)
Alkaline Phosphatase: 56 U/L (ref 39–117)
BUN: 16 mg/dL (ref 6–23)
CO2: 28 mEq/L (ref 19–32)
CREATININE: 0.99 mg/dL (ref 0.50–1.35)
Calcium: 9.8 mg/dL (ref 8.4–10.5)
Chloride: 106 mEq/L (ref 96–112)
Glucose, Bld: 94 mg/dL (ref 70–99)
Potassium: 4.3 mEq/L (ref 3.5–5.3)
Sodium: 142 mEq/L (ref 135–145)
Total Bilirubin: 0.5 mg/dL (ref 0.2–1.2)
Total Protein: 7.1 g/dL (ref 6.0–8.3)

## 2013-02-08 LAB — T4, FREE: Free T4: 1.17 ng/dL (ref 0.80–1.80)

## 2013-02-08 LAB — TSH: TSH: 1.888 u[IU]/mL (ref 0.350–4.500)

## 2013-02-08 LAB — T3, FREE: T3, Free: 3.4 pg/mL (ref 2.3–4.2)

## 2013-02-08 NOTE — Progress Notes (Signed)
*  PRELIMINARY RESULTS* Echocardiogram 2D Echocardiogram has been performed.  Anthony Underwood 02/08/2013, 10:41 AM

## 2013-02-08 NOTE — Progress Notes (Signed)
Attempted to contact the patient regarding the ECHO report. No answer but have left message for him to return call. Have also placed referral to cardiologist.

## 2013-02-09 LAB — URINALYSIS
Bilirubin Urine: NEGATIVE
Glucose, UA: NEGATIVE mg/dL
Hgb urine dipstick: NEGATIVE
KETONES UR: NEGATIVE mg/dL
LEUKOCYTES UA: NEGATIVE
NITRITE: NEGATIVE
PH: 5.5 (ref 5.0–8.0)
Protein, ur: NEGATIVE mg/dL
Specific Gravity, Urine: 1.022 (ref 1.005–1.030)
Urobilinogen, UA: 0.2 mg/dL (ref 0.0–1.0)

## 2013-02-11 LAB — CATECHOLAMINES, FRACTIONATED, PLASMA
Catecholamines, Total: 339 pg/mL
EPINEPHRINE: 59 pg/mL
NOREPINEPHRINE: 280 pg/mL

## 2013-02-13 ENCOUNTER — Encounter: Payer: Self-pay | Admitting: Family Medicine

## 2013-04-13 ENCOUNTER — Ambulatory Visit (INDEPENDENT_AMBULATORY_CARE_PROVIDER_SITE_OTHER): Payer: 59 | Admitting: Cardiology

## 2013-04-13 ENCOUNTER — Encounter: Payer: Self-pay | Admitting: Cardiology

## 2013-04-13 VITALS — BP 130/82 | HR 79 | Ht 73.0 in | Wt 239.0 lb

## 2013-04-13 DIAGNOSIS — I4891 Unspecified atrial fibrillation: Secondary | ICD-10-CM

## 2013-04-13 MED ORDER — ASPIRIN EC 81 MG PO TBEC
81.0000 mg | DELAYED_RELEASE_TABLET | Freq: Every day | ORAL | Status: DC
Start: 2013-04-13 — End: 2013-06-20

## 2013-04-13 MED ORDER — METOPROLOL TARTRATE 25 MG PO TABS: 25.0000 mg | ORAL_TABLET | Freq: Two times a day (BID) | ORAL | Status: DC

## 2013-04-13 NOTE — Progress Notes (Signed)
Clinical Summary [name] is a 38 y.o.male seen today as a new patient for chest pain   1. Afib/Aflutter - notes from 2010 noted afib and aflutter, up to 200 bpm - previously was on cardizem and coumadin - on further follow up, given a reported CHADS2Score of 0 coumadin was stopped and he was started on ASA daily.  - cardizem stopped a few years ago on his own b/c of dizzy spells while on medicine and also cost, he stopped his ASA as well. Has not followed up with cardiology  - feels episodes of palpitations, feels hot all over, sweaty, nausea. Last up to 1 hour. No chest pain. Occurs sporadically. Started approx 2011, feels like somewhat less frequent.  - no set triggers for episodes, though tends to be most often at night.  -From recent labs TSH 1.888, K4.3, normal catecholamines Echo 02/2013 LVEF 50-55%, no WMAs, grade I diastolic dysfunction   Past Medical History  Diagnosis Date  . Atrial fibrillation   . Atrial flutter      No Known Allergies   No current outpatient prescriptions on file.   No current facility-administered medications for this visit.     No past surgical history on file.   No Known Allergies    Family History  Problem Relation Age of Onset  . Heart disease Maternal Aunt     A fib  . Early death Paternal Uncle     40 y.o passed away from M.I     Social History [name] reports that he has quit smoking. He does not have any smokeless tobacco history on file. [name] reports that he drinks about .6 ounces of alcohol per week.   Review of Systems CONSTITUTIONAL: No weight loss, fever, chills, weakness or fatigue.  HEENT: Eyes: No visual loss, blurred vision, double vision or yellow sclerae.No hearing loss, sneezing, congestion, runny nose or sore throat.  SKIN: No rash or itching.  CARDIOVASCULAR: per HPI RESPIRATORY: No shortness of breath, cough or sputum.  GASTROINTESTINAL: No anorexia, nausea, vomiting or diarrhea. No abdominal  pain or blood.  GENITOURINARY: No burning on urination, no polyuria NEUROLOGICAL: No headache, dizziness, syncope, paralysis, ataxia, numbness or tingling in the extremities. No change in bowel or bladder control.  MUSCULOSKELETAL: No muscle, back pain, joint pain or stiffness.  LYMPHATICS: No enlarged nodes. No history of splenectomy.  PSYCHIATRIC: No history of depression or anxiety.  ENDOCRINOLOGIC: No reports of sweating, cold or heat intolerance. No polyuria or polydipsia.  Marland Kitchen.   Physical Examination p 79 bp 130/82 Wt 239 lbs BMI 32 Gen: resting comfortably, no acute distress HEENT: no scleral icterus, pupils equal round and reactive, no palptable cervical adenopathy,  CV: RRR, no m/r/g, no JVD, no carotid bruits Resp: Clear to auscultation bilaterally GI: abdomen is soft, non-tender, non-distended, normal bowel sounds, no hepatosplenomegaly MSK: extremities are warm, no edema.  Skin: warm, no rash Neuro:  no focal deficits Psych: appropriate affect   Diagnostic Studies Echo 02/2013 LVEF 50-55%, no WMAs, grade I diastolic dysfunction     Assessment and Plan  1. Afib/aflutter - symptoms may be related to uncontrolled arrythmia, he stopped his medications a few years ago - will obtain 21 day event monitor to correlate rhythm with symptoms - start metoprolol for rate control, this is a lower cost alternative to the dilt that he was on prevoiusly. Restart ASA. His CHADS2Vasc score remains 0.    F/u 1 month   Antoine PocheJonathan F. Jeovanni Heuring,  M.D., F.A.C.C.

## 2013-04-13 NOTE — Patient Instructions (Addendum)
Your physician recommends that you schedule a follow-up appointment in: 1 month    Your physician has recommended you make the following change in your medication:   Start metoprolol 25 mg twice a day  Start aspirin 81 mg daily  Your physician has recommended that you wear an event monitor for 21 days. Event monitors are medical devices that record the heart's electrical activity. Doctors most often us these monitors to diagnose arrhythmias. Arrhythmias are problems with the speed or rhythm of the heartbeat. The monitor is a small, portable device. You can wear one while you do your normal daily activities. This is usually used to diagnose what is causing palpitations/syncope (passing out).

## 2013-04-19 ENCOUNTER — Telehealth: Payer: Self-pay

## 2013-04-19 NOTE — Telephone Encounter (Signed)
[facility] called pt three times and is unable to reach pt to verify home address and therefore they have closed referral    I have left messages for pt, and spoke with mother    Forward to Dr.Branch

## 2013-05-22 ENCOUNTER — Encounter: Payer: Self-pay | Admitting: Cardiology

## 2013-05-22 ENCOUNTER — Encounter: Payer: 59 | Admitting: Cardiology

## 2013-05-22 NOTE — Progress Notes (Signed)
Clinical Summary [name] is a 38 y.o.male seen today for follow up of the following medical problems.   1. Afib/Aflutter  - notes from 2010 noted afib and aflutter, up to 200 bpm  - previously was on cardizem and coumadin  - on further follow up, given a reported CHADS2Score of 0 coumadin was stopped and he was started on ASA daily.  - cardizem stopped a few years ago on his own b/c of dizzy spells while on medicine and also cost, he stopped his ASA as well. Has not followed up with cardiology  - feels episodes of palpitations, feels hot all over, sweaty, nausea. Last up to 1 hour. No chest pain. Occurs sporadically. Started approx 2011, feels like somewhat less frequent.  - no set triggers for episodes, though tends to be most often at night.  -From recent labs TSH 1.888, K4.3, normal catecholamines  Echo 02/2013 LVEF 50-55%, no WMAs, grade I diastolic dysfunction  Last visit ordered an event monitor which has not yet been completed, also started metoprolol for symptoms  Past Medical History  Diagnosis Date  . Atrial fibrillation   . Atrial flutter      No Known Allergies   Current Outpatient Prescriptions  Medication Sig Dispense Refill  . aspirin EC 81 MG tablet Take 1 tablet (81 mg total) by mouth daily.  90 tablet  3  . metoprolol tartrate (LOPRESSOR) 25 MG tablet Take 1 tablet (25 mg total) by mouth 2 (two) times daily.  180 tablet  3   No current facility-administered medications for this visit.     No past surgical history on file.   No Known Allergies    Family History  Problem Relation Age of Onset  . Heart disease Maternal Aunt     A fib  . Early death Paternal Uncle     341 y.o passed away from M.I     Social History [name] reports that he has quit smoking. He does not have any smokeless tobacco history on file. [name] reports that he drinks about .6 ounces of alcohol per week.   Review of Systems CONSTITUTIONAL: No weight loss, fever,  chills, weakness or fatigue.  HEENT: Eyes: No visual loss, blurred vision, double vision or yellow sclerae.No hearing loss, sneezing, congestion, runny nose or sore throat.  SKIN: No rash or itching.  CARDIOVASCULAR:  RESPIRATORY: No shortness of breath, cough or sputum.  GASTROINTESTINAL: No anorexia, nausea, vomiting or diarrhea. No abdominal pain or blood.  GENITOURINARY: No burning on urination, no polyuria NEUROLOGICAL: No headache, dizziness, syncope, paralysis, ataxia, numbness or tingling in the extremities. No change in bowel or bladder control.  MUSCULOSKELETAL: No muscle, back pain, joint pain or stiffness.  LYMPHATICS: No enlarged nodes. No history of splenectomy.  PSYCHIATRIC: No history of depression or anxiety.  ENDOCRINOLOGIC: No reports of sweating, cold or heat intolerance. No polyuria or polydipsia.  Marland Kitchen.   Physical Examination There were no vitals filed for this visit. There were no vitals filed for this visit.  Gen: resting comfortably, no acute distress HEENT: no scleral icterus, pupils equal round and reactive, no palptable cervical adenopathy,  CV Resp: Clear to auscultation bilaterally GI: abdomen is soft, non-tender, non-distended, normal bowel sounds, no hepatosplenomegaly MSK: extremities are warm, no edema.  Skin: warm, no rash Neuro:  no focal deficits Psych: appropriate affect   Diagnostic Studies Echo 02/2013 LVEF 50-55%, no WMAs, grade I diastolic dysfunction  Event monitor Ordered, not completed  02/2013 Echo Study Conclusions  - Left ventricle: The cavity size was normal. Wall thickness was normal. Systolic function was low normal. The estimated ejection fraction was in the range of 50% to 55%. Wall motion was normal; there were no regional wall motion abnormalities. Doppler parameters are consistent with abnormal left ventricular relaxation (grade 1 diastolic dysfunction). - Ventricular septum: Septal motion showed abnormal function and  dyssynergy. These changes are consistent with intraventricular conduction delay. - Atrial septum: Agitated saline contrast was administered, which did not demonstrate an atrial level shunt.    Assessment and Plan  1. Afib/aflutter  - symptoms may be related to uncontrolled arrythmia, he stopped his medications a few years ago  - will obtain 21 day event monitor to correlate rhythm with symptoms  - start metoprolol for rate control, this is a lower cost alternative to the dilt that he was on prevoiusly. Restart ASA. His CHADS2Vasc score remains 0.        Antoine PocheJonathan F. Dyna Figuereo, M.D., F.A.C.C.

## 2013-06-04 NOTE — Progress Notes (Signed)
This encounter was created in error - please disregard.

## 2013-06-20 ENCOUNTER — Encounter (HOSPITAL_COMMUNITY): Payer: Self-pay | Admitting: Emergency Medicine

## 2013-06-20 ENCOUNTER — Emergency Department (HOSPITAL_COMMUNITY): Payer: 59

## 2013-06-20 ENCOUNTER — Emergency Department (HOSPITAL_COMMUNITY)
Admission: EM | Admit: 2013-06-20 | Discharge: 2013-06-20 | Disposition: A | Discharge status: Home / Self Care | Payer: 59 | Attending: Emergency Medicine | Admitting: Emergency Medicine

## 2013-06-20 DIAGNOSIS — Z8679 Personal history of other diseases of the circulatory system: Secondary | ICD-10-CM | POA: Insufficient documentation

## 2013-06-20 DIAGNOSIS — K5289 Other specified noninfective gastroenteritis and colitis: Secondary | ICD-10-CM | POA: Insufficient documentation

## 2013-06-20 DIAGNOSIS — Z87891 Personal history of nicotine dependence: Secondary | ICD-10-CM | POA: Insufficient documentation

## 2013-06-20 DIAGNOSIS — R000 Tachycardia, unspecified: Secondary | ICD-10-CM | POA: Insufficient documentation

## 2013-06-20 DIAGNOSIS — K529 Noninfective gastroenteritis and colitis, unspecified: Secondary | ICD-10-CM

## 2013-06-20 HISTORY — DX: Syncope and collapse: R55

## 2013-06-20 LAB — BASIC METABOLIC PANEL
BUN: 22 mg/dL (ref 6–23)
CALCIUM: 9.7 mg/dL (ref 8.4–10.5)
CO2: 26 meq/L (ref 19–32)
Chloride: 99 mEq/L (ref 96–112)
Creatinine, Ser: 1.05 mg/dL (ref 0.50–1.35)
GFR calc non Af Amer: 88 mL/min — ABNORMAL LOW (ref >90)
Glucose, Bld: 164 mg/dL — ABNORMAL HIGH (ref 70–99)
Potassium: 4.1 mEq/L (ref 3.7–5.3)
SODIUM: 142 meq/L (ref 137–147)

## 2013-06-20 LAB — CBC WITH DIFFERENTIAL/PLATELET
Basophils Absolute: 0 10*3/uL (ref 0.0–0.1)
Basophils Relative: 0 % (ref 0–1)
Eosinophils Absolute: 0 10*3/uL (ref 0.0–0.7)
Eosinophils Relative: 0 % (ref 0–5)
HCT: 46.3 % (ref 39.0–52.0)
Hemoglobin: 16.3 g/dL (ref 13.0–17.0)
LYMPHS PCT: 4 % — AB (ref 12–46)
Lymphs Abs: 0.6 10*3/uL — ABNORMAL LOW (ref 0.7–4.0)
MCH: 31.6 pg (ref 26.0–34.0)
MCHC: 35.2 g/dL (ref 30.0–36.0)
MCV: 89.7 fL (ref 78.0–100.0)
Monocytes Absolute: 0.6 10*3/uL (ref 0.1–1.0)
Monocytes Relative: 4 % (ref 3–12)
NEUTROS ABS: 13 10*3/uL — AB (ref 1.7–7.7)
Neutrophils Relative %: 92 % — ABNORMAL HIGH (ref 43–77)
Platelets: 222 10*3/uL (ref 150–400)
RBC: 5.16 MIL/uL (ref 4.22–5.81)
RDW: 12.9 % (ref 11.5–15.5)
WBC: 14.2 10*3/uL — AB (ref 4.0–10.5)

## 2013-06-20 LAB — URINALYSIS, ROUTINE W REFLEX MICROSCOPIC
Bilirubin Urine: NEGATIVE
GLUCOSE, UA: NEGATIVE mg/dL
Hgb urine dipstick: NEGATIVE
Ketones, ur: NEGATIVE mg/dL
LEUKOCYTES UA: NEGATIVE
NITRITE: NEGATIVE
PROTEIN: NEGATIVE mg/dL
Specific Gravity, Urine: 1.015 (ref 1.005–1.030)
Urobilinogen, UA: 0.2 mg/dL (ref 0.0–1.0)
pH: 5.5 (ref 5.0–8.0)

## 2013-06-20 LAB — HEPATIC FUNCTION PANEL
ALT: 29 U/L (ref 0–53)
AST: 23 U/L (ref 0–37)
Albumin: 4.5 g/dL (ref 3.5–5.2)
Alkaline Phosphatase: 58 U/L (ref 39–117)
Bilirubin, Direct: <0.2 mg/dL (ref 0.0–0.3)
TOTAL PROTEIN: 8.2 g/dL (ref 6.0–8.3)
Total Bilirubin: 0.8 mg/dL (ref 0.3–1.2)

## 2013-06-20 LAB — LIPASE, BLOOD: LIPASE: 18 U/L (ref 11–59)

## 2013-06-20 LAB — TROPONIN I
Troponin I: <0.3 ng/mL (ref <0.30)
Troponin I: <0.3 ng/mL (ref <0.30)

## 2013-06-20 LAB — D-DIMER, QUANTITATIVE: D-Dimer, Quant: 0.57 ug/mL-FEU — ABNORMAL HIGH (ref 0.00–0.48)

## 2013-06-20 LAB — I-STAT CG4 LACTIC ACID, ED
LACTIC ACID, VENOUS: 3.48 mmol/L — AB (ref 0.5–2.2)
LACTIC ACID, VENOUS: 1.99 mmol/L (ref 0.5–2.2)

## 2013-06-20 MED ORDER — GI COCKTAIL ~~LOC~~
30.0000 mL | Freq: Once | ORAL | Status: AC
  Administered 2013-06-20: 30 mL via ORAL
  Filled 2013-06-20: qty 30

## 2013-06-20 MED ORDER — IOHEXOL 350 MG/ML SOLN
100.0000 mL | Freq: Once | INTRAVENOUS | Status: AC | PRN
  Administered 2013-06-20: 100 mL via INTRAVENOUS

## 2013-06-20 MED ORDER — ONDANSETRON HCL 4 MG/2ML IJ SOLN
4.0000 mg | Freq: Once | INTRAMUSCULAR | Status: AC
  Administered 2013-06-20: 4 mg via INTRAVENOUS
  Filled 2013-06-20: qty 2

## 2013-06-20 MED ORDER — ONDANSETRON HCL 4 MG PO TABS: 4.0000 mg | ORAL_TABLET | Freq: Four times a day (QID) | ORAL | Status: DC

## 2013-06-20 MED ORDER — SODIUM CHLORIDE 0.9 % IV BOLUS (SEPSIS)
1000.0000 mL | Freq: Once | INTRAVENOUS | Status: AC
Start: 2013-06-20 — End: 2013-06-20
  Administered 2013-06-20: 1000 mL via INTRAVENOUS

## 2013-06-20 MED ORDER — SODIUM CHLORIDE 0.9 % IV BOLUS (SEPSIS)
1000.0000 mL | Freq: Once | INTRAVENOUS | Status: AC
  Administered 2013-06-20: 1000 mL via INTRAVENOUS

## 2013-06-20 NOTE — Discharge Instructions (Signed)
Viral Gastroenteritis Keep yourself hydrated at home and take antibiotics as prescribed. Followup with your Dr. this week. Return to the ED if you develop new or worsening symptoms. Viral gastroenteritis is also known as stomach flu. This condition affects the stomach and intestinal tract. It can cause sudden diarrhea and vomiting. The illness typically lasts 3 to 8 days. Most people develop an immune response that eventually gets rid of the virus. While this natural response develops, the virus can make you quite ill. CAUSES  Many different viruses can cause gastroenteritis, such as rotavirus or noroviruses. You can catch one of these viruses by consuming contaminated food or water. You may also catch a virus by sharing utensils or other personal items with an infected person or by touching a contaminated surface. SYMPTOMS  The most common symptoms are diarrhea and vomiting. These problems can cause a severe loss of body fluids (dehydration) and a body salt (electrolyte) imbalance. Other symptoms may include:  Fever.  Headache.  Fatigue.  Abdominal pain. DIAGNOSIS  Your caregiver can usually diagnose viral gastroenteritis based on your symptoms and a physical exam. A stool sample may also be taken to test for the presence of viruses or other infections. TREATMENT  This illness typically goes away on its own. Treatments are aimed at rehydration. The most serious cases of viral gastroenteritis involve vomiting so severely that you are not able to keep fluids down. In these cases, fluids must be given through an intravenous line (IV). HOME CARE INSTRUCTIONS   Drink enough fluids to keep your urine clear or pale yellow. Drink small amounts of fluids frequently and increase the amounts as tolerated.  Ask your caregiver for specific rehydration instructions.  Avoid:  Foods high in sugar.  Alcohol.  Carbonated drinks.  Tobacco.  Juice.  Caffeine drinks.  Extremely hot or cold  fluids.  Fatty, greasy foods.  Too much intake of anything at one time.  Dairy products until 24 to 48 hours after diarrhea stops.  You may consume probiotics. Probiotics are active cultures of beneficial bacteria. They may lessen the amount and number of diarrheal stools in adults. Probiotics can be found in yogurt with active cultures and in supplements.  Wash your hands well to avoid spreading the virus.  Only take over-the-counter or prescription medicines for pain, discomfort, or fever as directed by your caregiver. Do not give aspirin to children. Antidiarrheal medicines are not recommended.  Ask your caregiver if you should continue to take your regular prescribed and over-the-counter medicines.  Keep all follow-up appointments as directed by your caregiver. SEEK IMMEDIATE MEDICAL CARE IF:   You are unable to keep fluids down.  You do not urinate at least once every 6 to 8 hours.  You develop shortness of breath.  You notice blood in your stool or vomit. This may look like coffee grounds.  You have abdominal pain that increases or is concentrated in one small area (localized).  You have persistent vomiting or diarrhea.  You have a fever.  The patient is a child younger than 3 months, and he or she has a fever.  The patient is a child older than 3 months, and he or she has a fever and persistent symptoms.  The patient is a child older than 3 months, and he or she has a fever and symptoms suddenly get worse.  The patient is a baby, and he or she has no tears when crying. MAKE SURE YOU:   Understand these instructions.  Will  watch your condition.  Will get help right away if you are not doing well or get worse. Document Released: 12/21/2004 Document Revised: 03/15/2011 Document Reviewed: 10/07/2010 [facility] Patient Information 2015 [place], [place]. This information is not intended to replace advice given to you by your health care provider. Make sure you discuss  any questions you have with your health care provider.

## 2013-06-20 NOTE — ED Notes (Signed)
Pt in bed. Asymp. VSS

## 2013-06-20 NOTE — ED Provider Notes (Signed)
CSN: [id number]     Arrival date & time 06/20/13  1445 History  This chart was scribed for Anthony OctaveStephen Rancour, MD by Shari HeritageAisha Amuda, ED Scribe. The patient was seen in room APA02/APA02. Patient's care was started at 3:11 PM.   Chief Complaint  Patient presents with  . Abdominal Pain    The history is provided by the patient. No language interpreter was used.    HPI Comments: Anthony MadeiraRoger L Underwood is a 38 y.o. male who presents to the Emergency Department complaining of sudden onset, moderate, constant epigastric abdominal pain that began 7 hours ago upon waking at 8 AM. There is associated nausea, vomiting and diarrhea that began last night. Patient states that he has had more than 50 episodes of diarrhea and 10 episodes of vomiting. He also reports a mild headache at this time. He denies hematuria, hematochezia, melena, chest pain, shortness of breath, back pain. He denies any recent travel outside of the [place] Or antibiotic use. He has a history of atrial fibrillation and takes metoprolol 25 mg bid.  Cardiologist - Branch with [facility] in [place]  Past Medical History  Diagnosis Date  . Atrial fibrillation   . Atrial flutter   . Syncope    History reviewed. No pertinent past surgical history. Family History  Problem Relation Age of Onset  . Heart disease Maternal Aunt     A fib  . Early death Paternal Uncle     40 y.o passed away from M.I   History  Substance Use Topics  . Smoking status: Former Games developermoker  . Smokeless tobacco: Not on file  . Alcohol Use: 0.6 oz/week    1 Cans of beer per week     Comment: Social    Review of Systems A complete 10 system review of systems was obtained and all systems are negative except as noted in the HPI and PMH.   Allergies  Review of patient's allergies indicates no known allergies.  Home Medications   Prior to Admission medications   Medication Sig Start Date End Date Taking? Authorizing Provider  acetaminophen (TYLENOL) 500 MG tablet Take 1,000  mg by mouth every 6 (six) hours as needed. pain   Yes Historical Provider, MD  ondansetron (ZOFRAN) 4 MG tablet Take 1 tablet (4 mg total) by mouth every 6 (six) hours. 06/20/13   Anthony OctaveStephen Rancour, MD   Triage Vitals: BP 126/82  Pulse 131  Resp 18  Ht 6\' 1"  (1.854 m)  Wt 237 lb (107.502 kg)  BMI 31.27 kg/m2  SpO2 98% Physical Exam  Nursing note and vitals reviewed. Constitutional: He is oriented to person, place, and time. He appears well-developed and well-nourished. No distress.  No diaphoresis.  HENT:  Head: Normocephalic and atraumatic.  Mouth/Throat: Oropharynx is clear and moist. No oropharyngeal exudate.  Eyes: Conjunctivae and EOM are normal. Pupils are equal, round, and reactive to light.  Neck: Normal range of motion. Neck supple.  No meningismus.  Cardiovascular: Regular rhythm, normal heart sounds and intact distal pulses.  Tachycardia present.   No murmur heard. Pulmonary/Chest: Effort normal and breath sounds normal. No respiratory distress.  Abdominal: Soft. There is tenderness in the epigastric area. There is no rebound and no guarding.  Musculoskeletal: Normal range of motion. He exhibits no edema and no tenderness.  Neurological: He is alert and oriented to person, place, and time. No cranial nerve deficit. He exhibits normal muscle tone. Coordination normal.  No ataxia on finger to nose bilaterally. No pronator drift. 5/5  strength throughout. CN 2-12 intact. Negative Romberg. Equal grip strength. Sensation intact. Gait is normal.   Skin: Skin is warm.  Psychiatric: He has a normal mood and affect. His behavior is normal.    ED Course  Procedures (including critical care time) DIAGNOSTIC STUDIES: Oxygen Saturation is 98% on room air, normal by my interpretation.    COORDINATION OF CARE: 11:47 PM- Will order IV fluids, Zofran, GI cocktail, CXR, d-dimer, hepatic function panel, blood lipase, troponin, CBC with diff, BMP, UA. Patient informed of current plan for  treatment and evaluation and agrees with plan at this time.     Labs Review Labs Reviewed  CBC WITH DIFFERENTIAL - Abnormal; Notable for the following:    WBC 14.2 (*)    Neutrophils Relative % 92 (*)    Neutro Abs 13.0 (*)    Lymphocytes Relative 4 (*)    Lymphs Abs 0.6 (*)    All other components within normal limits  BASIC METABOLIC PANEL - Abnormal; Notable for the following:    Glucose, Bld 164 (*)    GFR calc non Af Amer 88 (*)    All other components within normal limits  D-DIMER, QUANTITATIVE - Abnormal; Notable for the following:    D-Dimer, Quant 0.57 (*)    All other components within normal limits  I-STAT CG4 LACTIC ACID, ED - Abnormal; Notable for the following:    Lactic Acid, Venous 3.48 (*)    All other components within normal limits  URINALYSIS, ROUTINE W REFLEX MICROSCOPIC  HEPATIC FUNCTION PANEL  LIPASE, BLOOD  TROPONIN I  TROPONIN I  I-STAT CG4 LACTIC ACID, ED    Imaging Review Ct Angio Chest Pe W/cm &/or Wo Cm  06/20/2013   CLINICAL DATA:  CP, SOB, tachycardia; ABDOMINAL PAIN  EXAM: CT ANGIOGRAPHY CHEST  CT ABDOMEN AND PELVIS WITH CONTRAST  TECHNIQUE: Multidetector CT imaging of the chest was performed using the standard protocol during bolus administration of intravenous contrast. Multiplanar CT image reconstructions and MIPs were obtained to evaluate the vascular anatomy. Multidetector CT imaging of the abdomen and pelvis was performed using the standard protocol during bolus administration of intravenous contrast.  CONTRAST:  OMNIPAQUE IOHEXOL 350 MG/ML SOLN  COMPARISON:  None.  FINDINGS: CTA CHEST FINDINGS  Satisfactory opacification of pulmonary arteries noted, and there is no evidence of pulmonary emboli. Adequate contrast opacification of the thoracic aorta with no evidence of dissection, aneurysm, or stenosis. There is classic 3-vessel brachiocephalic arch anatomy without proximal stenosis. No pleural or pericardial effusion. No hilar or  mediastinal adenopathy. Dependent atelectasis posteriorly in both lower lobes. Lungs otherwise clear. Minimal spurring in the mid thoracic spine. Sternum intact.  CT ABDOMEN and PELVIS FINDINGS  Diffuse fatty infiltration of the liver without focal lesion. Unremarkable spleen, adrenal glands, pancreas chronic advance, abdominal aorta, portal vein, gallbladder. Stomach is distended by ingested material. Small bowel and colon are nondilated. Normal appendix. Urinary bladder incompletely distended. Mild prostatic enlargement. Bilateral pelvic phleboliths. No ascites. No free air. Subcentimeter left periaortic lymph nodes. No pelvic or mesenteric adenopathy. Lumbar spine and bony pelvis unremarkable.  Review of the MIP images confirms the above findings.  IMPRESSION: 1. Negative for acute PE or thoracic aortic dissection. 2. Diffuse fatty infiltration of the liver.   Electronically Signed   By: Oley Balm M.D.   On: 06/20/2013 16:48   Ct Abdomen Pelvis W Contrast  06/20/2013   CLINICAL DATA:  CP, SOB, tachycardia; ABDOMINAL PAIN  EXAM: CT ANGIOGRAPHY CHEST  CT ABDOMEN AND PELVIS WITH CONTRAST  TECHNIQUE: Multidetector CT imaging of the chest was performed using the standard protocol during bolus administration of intravenous contrast. Multiplanar CT image reconstructions and MIPs were obtained to evaluate the vascular anatomy. Multidetector CT imaging of the abdomen and pelvis was performed using the standard protocol during bolus administration of intravenous contrast.  CONTRAST:  OMNIPAQUE IOHEXOL 350 MG/ML SOLN  COMPARISON:  None.  FINDINGS: CTA CHEST FINDINGS  Satisfactory opacification of pulmonary arteries noted, and there is no evidence of pulmonary emboli. Adequate contrast opacification of the thoracic aorta with no evidence of dissection, aneurysm, or stenosis. There is classic 3-vessel brachiocephalic arch anatomy without proximal stenosis. No pleural or pericardial effusion. No hilar or  mediastinal adenopathy. Dependent atelectasis posteriorly in both lower lobes. Lungs otherwise clear. Minimal spurring in the mid thoracic spine. Sternum intact.  CT ABDOMEN and PELVIS FINDINGS  Diffuse fatty infiltration of the liver without focal lesion. Unremarkable spleen, adrenal glands, pancreas chronic advance, abdominal aorta, portal vein, gallbladder. Stomach is distended by ingested material. Small bowel and colon are nondilated. Normal appendix. Urinary bladder incompletely distended. Mild prostatic enlargement. Bilateral pelvic phleboliths. No ascites. No free air. Subcentimeter left periaortic lymph nodes. No pelvic or mesenteric adenopathy. Lumbar spine and bony pelvis unremarkable.  Review of the MIP images confirms the above findings.  IMPRESSION: 1. Negative for acute PE or thoracic aortic dissection. 2. Diffuse fatty infiltration of the liver.   Electronically Signed   By: Oley Balm M.D.   On: 06/20/2013 16:48   Dg Chest Port 1 View  06/20/2013   CLINICAL DATA:  Increased heart right  EXAM: PORTABLE CHEST - 1 VIEW  COMPARISON:  Chest radiograph 11/25/2008  FINDINGS: Exam is lordotic. Lungs are clear. Normal cardiac silhouette with ectatic aorta.  IMPRESSION: No acute cardiopulmonary process.   Electronically Signed   By: Genevive Bi M.D.   On: 06/20/2013 15:44     EKG Interpretation   Date/Time:  Wednesday June 20 2013 17:21:06 EDT Ventricular Rate:  89 PR Interval:  218 QRS Duration: 116 QT Interval:  354 QTC Calculation: 431 R Axis:   22 Text Interpretation:  Sinus rhythm Prolonged PR interval Nonspecific  intraventricular conduction delay Baseline wander in lead(s) V1 No  significant change was found Confirmed by Manus Gunning  MD, STEPHEN [phone]) on  06/20/2013 5:26:58 PM      MDM   Final diagnoses:  Gastroenteritis   Patient presents with abdominal pain, nausea, vomiting and diarrhea the onset around 4 AM. He wasll well when he went to bed. Vomiting started  around 4 and pain in his epigastrium has been intermittent since a.m. He is greater than 10 episodes of vomiting, 50 episodes of diarrhea.  Denies any blood in his stool or emesis. No sick contacts or recent travel.  Initial EKG showed concerning ST depressions in anterior leads. Posterior EKG showed minimal ST elevation in V2 and V3. This was discussed with Dr. Nicholaus Bloom of cardiology who believe that lead placement was incorrect as R-wave was down in lead 1 and aVL. Repeat EKG shows upright R-wave in aVL and 1 no ST segment changes. QT has normalized as well.  Leukocytosis with elevated lactate noted. Chemistry and LFTs Nomal.  CT negative for pulmonary embolism. CT abdomen negative for acute pathology.  Heart rate improved to 90s. Abdomen remains soft and nontender. No vomiting or diarrhea in the ED. Lactic acid is improved to 1.99  Continue supportive care for presumed gastroenteritis at home.  PO hydration, antiemetics. Patient in no distress on reevaluation. He is calm and texting on his phone.  Suspect EKG abnormalities due to lead placement as discussed with cardiology.  Troponin negative x2.  NSR in ED.  No CP or SOB.   BP 122/92  Pulse 82  Resp 20  Ht 6\' 1"  (1.854 m)  Wt 237 lb (107.502 kg)  BMI 31.27 kg/m2  SpO2 99%   I personally performed the services described in this documentation, which was scribed in my presence. The recorded information has been reviewed and is accurate.   Anthony OctaveStephen Rancour, MD 06/20/13 2350

## 2013-06-20 NOTE — ED Notes (Signed)
Upper abd pain with n/v/d starting last night.

## 2018-09-11 ENCOUNTER — Emergency Department (HOSPITAL_COMMUNITY)
Admission: EM | Admit: 2018-09-11 | Discharge: 2018-09-11 | Discharge status: Against Medical Advice | Payer: Self-pay | Attending: Emergency Medicine | Admitting: Emergency Medicine

## 2018-09-11 ENCOUNTER — Encounter (HOSPITAL_COMMUNITY): Payer: Self-pay | Admitting: Emergency Medicine

## 2018-09-11 ENCOUNTER — Other Ambulatory Visit: Payer: Self-pay

## 2018-09-11 ENCOUNTER — Emergency Department (HOSPITAL_COMMUNITY): Payer: Self-pay

## 2018-09-11 DIAGNOSIS — I4891 Unspecified atrial fibrillation: Secondary | ICD-10-CM | POA: Insufficient documentation

## 2018-09-11 DIAGNOSIS — S20222A Contusion of left back wall of thorax, initial encounter: Secondary | ICD-10-CM | POA: Insufficient documentation

## 2018-09-11 DIAGNOSIS — Z87891 Personal history of nicotine dependence: Secondary | ICD-10-CM | POA: Insufficient documentation

## 2018-09-11 DIAGNOSIS — Y9389 Activity, other specified: Secondary | ICD-10-CM | POA: Insufficient documentation

## 2018-09-11 DIAGNOSIS — I4892 Unspecified atrial flutter: Secondary | ICD-10-CM | POA: Insufficient documentation

## 2018-09-11 DIAGNOSIS — Y999 Unspecified external cause status: Secondary | ICD-10-CM | POA: Insufficient documentation

## 2018-09-11 DIAGNOSIS — Y929 Unspecified place or not applicable: Secondary | ICD-10-CM | POA: Insufficient documentation

## 2018-09-11 DIAGNOSIS — S20212A Contusion of left front wall of thorax, initial encounter: Secondary | ICD-10-CM

## 2018-09-11 MED ORDER — IBUPROFEN 400 MG PO TABS
400.0000 mg | ORAL_TABLET | Freq: Once | ORAL | Status: AC
  Administered 2018-09-11: 400 mg via ORAL
  Filled 2018-09-11: qty 1
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
## 2018-09-11 NOTE — ED Notes (Signed)
Per phlebotomist, patient refused to allow blood draw.
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
## 2018-09-11 NOTE — ED Notes (Signed)
Dr Wyvonnia Dusky talking with pt
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
## 2018-09-11 NOTE — ED Notes (Signed)
Pt notified this nurse that he is leaving ED now. Pt walked out with sig other. Dr Wyvonnia Dusky aware.
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
## 2022-07-25 ENCOUNTER — Emergency Department (HOSPITAL_COMMUNITY): Payer: 59

## 2022-07-25 ENCOUNTER — Emergency Department (HOSPITAL_COMMUNITY)
Admission: EM | Admit: 2022-07-25 | Discharge: 2022-07-25 | Disposition: A | Discharge status: Home / Self Care | Payer: 59 | Attending: Emergency Medicine | Admitting: Emergency Medicine

## 2022-07-25 ENCOUNTER — Encounter (HOSPITAL_COMMUNITY): Payer: Self-pay | Admitting: *Deleted

## 2022-07-25 ENCOUNTER — Other Ambulatory Visit: Payer: Self-pay

## 2022-07-25 DIAGNOSIS — S00212A Abrasion of left eyelid and periocular area, initial encounter: Secondary | ICD-10-CM | POA: Insufficient documentation

## 2022-07-25 DIAGNOSIS — Y92410 Unspecified street and highway as the place of occurrence of the external cause: Secondary | ICD-10-CM | POA: Diagnosis not present

## 2022-07-25 DIAGNOSIS — S20312A Abrasion of left front wall of thorax, initial encounter: Secondary | ICD-10-CM | POA: Diagnosis not present

## 2022-07-25 DIAGNOSIS — M7918 Myalgia, other site: Secondary | ICD-10-CM | POA: Insufficient documentation

## 2022-07-25 DIAGNOSIS — T07XXXA Unspecified multiple injuries, initial encounter: Secondary | ICD-10-CM

## 2022-07-25 LAB — URINALYSIS, ROUTINE W REFLEX MICROSCOPIC
Bacteria, UA: NONE SEEN
Bilirubin Urine: NEGATIVE
Glucose, UA: NEGATIVE mg/dL
Ketones, ur: NEGATIVE mg/dL
Leukocytes,Ua: NEGATIVE
Nitrite: NEGATIVE
Protein, ur: NEGATIVE mg/dL
Specific Gravity, Urine: 1.004 — ABNORMAL LOW (ref 1.005–1.030)
pH: 6 (ref 5.0–8.0)

## 2022-07-25 LAB — COMPREHENSIVE METABOLIC PANEL
ALT: 22 U/L (ref 0–44)
AST: 30 U/L (ref 15–41)
Albumin: 4.3 g/dL (ref 3.5–5.0)
Alkaline Phosphatase: 51 U/L (ref 38–126)
Anion gap: 8 (ref 5–15)
BUN: 16 mg/dL (ref 6–20)
CO2: 25 mmol/L (ref 22–32)
Calcium: 8.7 mg/dL — ABNORMAL LOW (ref 8.9–10.3)
Chloride: 103 mmol/L (ref 98–111)
Creatinine, Ser: 1.13 mg/dL (ref 0.61–1.24)
GFR, Estimated: >60 mL/min (ref >60)
Glucose, Bld: 75 mg/dL (ref 70–99)
Potassium: 3.7 mmol/L (ref 3.5–5.1)
Sodium: 136 mmol/L (ref 135–145)
Total Bilirubin: 1.2 mg/dL (ref 0.3–1.2)
Total Protein: 7.4 g/dL (ref 6.5–8.1)

## 2022-07-25 LAB — CBC
HCT: 41.3 % (ref 39.0–52.0)
Hemoglobin: 14.2 g/dL (ref 13.0–17.0)
MCH: 31 pg (ref 26.0–34.0)
MCHC: 34.4 g/dL (ref 30.0–36.0)
MCV: 90.2 fL (ref 80.0–100.0)
Platelets: 241 10*3/uL (ref 150–400)
RBC: 4.58 MIL/uL (ref 4.22–5.81)
RDW: 13.2 % (ref 11.5–15.5)
WBC: 11 10*3/uL — ABNORMAL HIGH (ref 4.0–10.5)
nRBC: 0 % (ref 0.0–0.2)

## 2022-07-25 MED ORDER — KETOROLAC TROMETHAMINE 30 MG/ML IJ SOLN
15.0000 mg | Freq: Once | INTRAMUSCULAR | Status: AC
  Administered 2022-07-25: 15 mg via INTRAVENOUS
  Filled 2022-07-25: qty 1

## 2022-07-25 MED ORDER — IOHEXOL 300 MG/ML  SOLN
100.0000 mL | Freq: Once | INTRAMUSCULAR | Status: AC | PRN
  Administered 2022-07-25: 100 mL via INTRAVENOUS

## 2022-07-25 MED ORDER — ERYTHROMYCIN 5 MG/GM OP OINT
TOPICAL_OINTMENT | Freq: Once | OPHTHALMIC | Status: AC
  Administered 2022-07-25: 1 via OPHTHALMIC
  Filled 2022-07-25: qty 3.5
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
## 2022-07-25 NOTE — ED Notes (Signed)
Pt states he did take his BP medication today but does have hx of Hypertension.

## 2022-07-25 NOTE — ED Notes (Signed)
ED Provider at bedside. 
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
